# Patient Record
Sex: Male | Born: 1995 | Race: White | Hispanic: No | Marital: Single | Smoking: Current every day smoker
Health system: Southern US, Community
[De-identification: ages and names within clinical notes are randomized; demographics above are authoritative.]

## PROBLEM LIST (undated history)

## (undated) DIAGNOSIS — F329 Major depressive disorder, single episode, unspecified: Secondary | ICD-10-CM

## (undated) DIAGNOSIS — F509 Eating disorder, unspecified: Secondary | ICD-10-CM

## (undated) DIAGNOSIS — F901 Attention-deficit hyperactivity disorder, predominantly hyperactive type: Secondary | ICD-10-CM

## (undated) DIAGNOSIS — F419 Anxiety disorder, unspecified: Secondary | ICD-10-CM

## (undated) DIAGNOSIS — F32A Depression, unspecified: Secondary | ICD-10-CM

## (undated) DIAGNOSIS — F4310 Post-traumatic stress disorder, unspecified: Secondary | ICD-10-CM

---
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
## 2020-03-13 ENCOUNTER — Telehealth: Payer: Self-pay | Admitting: Family Medicine

## 2020-03-13 NOTE — Telephone Encounter (Signed)
Pt called wanting to make appointment with dr Para March.  Last time he in was in office 07/2015  Appointment for anxiety [employer]   Ok to schedule

## 2020-03-13 NOTE — Telephone Encounter (Signed)
30 min OV when possible.  Please check to see if any urgent issues.  Thanks.

## 2020-03-14 NOTE — Telephone Encounter (Signed)
Pt reports this is not acute. Scheduled pt for 7/20. Advised if anything is needed before apt to contact office. Pt verbalized understanding.

## 2020-03-21 ENCOUNTER — Ambulatory Visit (INDEPENDENT_AMBULATORY_CARE_PROVIDER_SITE_OTHER): Payer: No Typology Code available for payment source | Admitting: Family Medicine

## 2020-03-21 ENCOUNTER — Other Ambulatory Visit: Payer: Self-pay

## 2020-03-21 ENCOUNTER — Encounter: Payer: Self-pay | Admitting: Family Medicine

## 2020-03-21 VITALS — BP 102/72 | HR 65 | Temp 97.7°F | Ht 68.0 in | Wt 114.2 lb

## 2020-03-21 DIAGNOSIS — Z7189 Other specified counseling: Secondary | ICD-10-CM

## 2020-03-21 DIAGNOSIS — R4586 Emotional lability: Secondary | ICD-10-CM | POA: Diagnosis not present

## 2020-03-21 DIAGNOSIS — F39 Unspecified mood [affective] disorder: Secondary | ICD-10-CM | POA: Diagnosis not present

## 2020-03-21 NOTE — Patient Instructions (Signed)
Go to the lab on the way out.   If you have mychart we'll likely use that to update you.    Let me see about options in the meantime when I see your labs.  We'll be in touch.  Take care.  Glad to see you.

## 2020-03-21 NOTE — Progress Notes (Signed)
This visit occurred during the SARS-CoV-2 public health emergency.  Safety protocols were in place, including screening questions prior to the visit, additional usage of staff PPE, and extensive cleaning of exam room while observing appropriate contact time as indicated for disinfecting solutions.  Reestablishing care.  Seen here greater than 3 years ago.  Mood discussed with patient. More anxiety, dec in libido, dec in motivation.  Going on for months, more in the last few months.  Social detachment noted.  No SI/HI.  More irritable when anxious.    Discussed his weight, eating, his prev hx.  Appetite can be affected by THC, with variable intake.  "One part of me is fine with my weight and another part of me wants me bigger/muscular or smaller."  He would prefer not to see his weight on check in.    No recent self harm.    We talked about his mood variation specifically.  He has had episodes about once a month where he has increased energy, he is more likely to make "bad decisions" where he spends money, drinks more alcohol and has insomnia.  There has been an episode where he drove from [place] to [place] and then back because he could not sleep.  During these episodes he has difficulty getting calm.  They can last for days at a time.  Advance directive discussed with patient.  [employer] designated if patient were incapacitated.  Her phone number is [phone].  He is in a stable same-sex relationship with his partner [place].  He came out to his family when he was approximately 24 years of age.  He denies cocaine or heroin use but does drink alcohol.  He is smoked marijuana in the past.  He has cut back on alcohol use recently.  He vapes with an e-cigarette.  Meds, vitals, and allergies reviewed.   ROS: Per HPI unless specifically indicated in ROS section   GEN: nad, alert and oriented HEENT: ncat NECK: supple w/o LA CV: rrr.  no murmur PULM: ctab, no inc wob ABD: soft,  +bs EXT: no edema SKIN: no acute rash Speech normal.  No tremor.  Affect appropriate.  Judgment intact.  At least 30 minutes were devoted to patient care in this encounter (this can potentially include time spent reviewing the patient's file/history, interviewing and examining the patient, counseling/reviewing plan with patient, ordering referrals, ordering tests, reviewing relevant laboratory or x-ray data, and documenting the encounter).

## 2020-03-22 LAB — COMPREHENSIVE METABOLIC PANEL WITH GFR
ALT: 15 U/L (ref 0–53)
AST: 19 U/L (ref 0–37)
Albumin: 5.2 g/dL (ref 3.5–5.2)
Alkaline Phosphatase: 67 U/L (ref 39–117)
BUN: 11 mg/dL (ref 6–23)
CO2: 28 meq/L (ref 19–32)
Calcium: 10.7 mg/dL — ABNORMAL HIGH (ref 8.4–10.5)
Chloride: 104 meq/L (ref 96–112)
Creatinine, Ser: 1.06 mg/dL (ref 0.40–1.50)
GFR: 86.97 mL/min (ref >60.00)
Glucose, Bld: 84 mg/dL (ref 70–99)
Potassium: 5 meq/L (ref 3.5–5.1)
Sodium: 141 meq/L (ref 135–145)
Total Bilirubin: 0.9 mg/dL (ref 0.2–1.2)
Total Protein: 7.5 g/dL (ref 6.0–8.3)

## 2020-03-22 LAB — CBC WITH DIFFERENTIAL/PLATELET
Basophils Absolute: 0 K/uL (ref 0.0–0.1)
Basophils Relative: 0.4 % (ref 0.0–3.0)
Eosinophils Absolute: 0.1 K/uL (ref 0.0–0.7)
Eosinophils Relative: 1.3 % (ref 0.0–5.0)
HCT: 47.6 % (ref 39.0–52.0)
Hemoglobin: 16.8 g/dL (ref 13.0–17.0)
Lymphocytes Relative: 31.8 % (ref 12.0–46.0)
Lymphs Abs: 2.3 K/uL (ref 0.7–4.0)
MCHC: 35.2 g/dL (ref 30.0–36.0)
MCV: 89.6 fl (ref 78.0–100.0)
Monocytes Absolute: 0.5 K/uL (ref 0.1–1.0)
Monocytes Relative: 6.8 % (ref 3.0–12.0)
Neutro Abs: 4.3 K/uL (ref 1.4–7.7)
Neutrophils Relative %: 59.7 % (ref 43.0–77.0)
Platelets: 162 K/uL (ref 150.0–400.0)
RBC: 5.31 Mil/uL (ref 4.22–5.81)
RDW: 13 % (ref 11.5–15.5)
WBC: 7.1 K/uL (ref 4.0–10.5)

## 2020-03-22 LAB — TSH: TSH: 1.14 u[IU]/mL (ref 0.35–4.50)

## 2020-03-24 ENCOUNTER — Encounter: Payer: Self-pay | Admitting: Family Medicine

## 2020-03-24 ENCOUNTER — Other Ambulatory Visit: Payer: Self-pay | Admitting: Family Medicine

## 2020-03-24 DIAGNOSIS — Z7189 Other specified counseling: Secondary | ICD-10-CM | POA: Insufficient documentation

## 2020-03-24 MED ORDER — DIVALPROEX SODIUM 500 MG PO DR TAB: 500.0000 mg | DELAYED_RELEASE_TABLET | Freq: Every day | ORAL | 1 refills | Status: DC

## 2020-03-24 MED ORDER — DIVALPROEX SODIUM 500 MG PO DR TAB: 500.0000 mg | DELAYED_RELEASE_TABLET | Freq: Three times a day (TID) | ORAL | 1 refills | Status: DC

## 2020-03-24 NOTE — Assessment & Plan Note (Signed)
I suspect based on his history described above with episodic periods of insomnia, increased energy, etc. that he likely is bipolar, discussed with patient.  I think it makes sense to check his labs first and he agrees.  See notes on labs.  We deferred medication options until we can get his labs resulted.  He does have history of substance use with vaping, alcohol, but I do not suspect that he has a substance induced mood disorder.  He may have a substance influenced mood disorder but I do not think substance is the primary issue here.  No suicidal homicidal intent and okay for outpatient follow-up.  We talked about his previous eating habits.  He prefers not to have his weight revealed on check-in and he can remind [place] about this on check-in.  See notes on labs.  We talked about getting him set up with [facility] again with counseling.  See above.

## 2020-03-24 NOTE — Assessment & Plan Note (Signed)
Advance directive discussed with patient.  [employer] designated if patient were incapacitated.  Her phone number is [phone].

## 2020-03-30 ENCOUNTER — Telehealth: Payer: Self-pay | Admitting: Family Medicine

## 2020-03-30 NOTE — Telephone Encounter (Signed)
Called Heather Kitchens office to try to set up Appt for patient. She is not accepting New patients at this time. Called patient to ask if he wanted me to send Referral to [facility] and he declined, appointment with you next Tuesday.

## 2020-03-31 NOTE — Telephone Encounter (Signed)
Noted, will d/w pt at OV next week.

## 2020-04-04 ENCOUNTER — Ambulatory Visit: Payer: No Typology Code available for payment source | Admitting: Family Medicine

## 2020-04-04 ENCOUNTER — Other Ambulatory Visit: Payer: Self-pay

## 2020-04-04 ENCOUNTER — Encounter: Payer: Self-pay | Admitting: Family Medicine

## 2020-04-04 VITALS — BP 106/60 | HR 73 | Temp 97.6°F | Ht 68.0 in | Wt 114.2 lb

## 2020-04-04 DIAGNOSIS — F39 Unspecified mood [affective] disorder: Secondary | ICD-10-CM | POA: Diagnosis not present

## 2020-04-04 DIAGNOSIS — R4586 Emotional lability: Secondary | ICD-10-CM | POA: Diagnosis not present

## 2020-04-04 NOTE — Patient Instructions (Signed)
Go to the lab on the way out.   If you have mychart we'll likely use that to update you.    Don't change your meds for now.  We'll be in touch.  Take care.  Glad to see you. 

## 2020-04-04 NOTE — Progress Notes (Signed)
This visit occurred during the SARS-CoV-2 public health emergency.  Safety protocols were in place, including screening questions prior to the visit, additional usage of staff PPE, and extensive cleaning of exam room while observing appropriate contact time as indicated for disinfecting solutions.  Started on depakote in the meantime.  He feels cold from the waist up since starting med.  He was a little more irritable in the meantime, but still okay for outpatient follow-up.  Some nausea in the AMs.  Unclear if he was going to be more irritable in the meantime anyway due to an underlying mood change or if this was related to medication use.  He did not think his symptoms were severe enough to stop the medication at this point.  We both agreed that he had relatively small sample size in terms of days on treatment so far with Depakote and we may not be able to make an adequate determination about mood variation that would have happened anyway versus mood variation related to medication use.  His mood overall is lower than average now but not at the extreme of depression.  He does not endorse recent manic symptoms.  He had one day with skipped dose and his concentration was clearly worse that day.    Meds, vitals, and allergies reviewed.   ROS: Per HPI unless specifically indicated in ROS section   GEN: nad, alert and oriented HEENT:  ncat NECK: supple w/o LA CV: rrr. PULM: ctab, no inc wob ABD: soft, +bs EXT: no edema SKIN: no acute rash Speech normal.  No tremor. Normal capillary refill but his hands do feel cooler compared to his legs.  Normal radial pulse bilaterally.

## 2020-04-05 LAB — COMPREHENSIVE METABOLIC PANEL
ALT: 10 U/L (ref 0–53)
AST: 20 U/L (ref 0–37)
Albumin: 5.2 g/dL (ref 3.5–5.2)
Alkaline Phosphatase: 65 U/L (ref 39–117)
BUN: 12 mg/dL (ref 6–23)
CO2: 29 mEq/L (ref 19–32)
Calcium: 10.4 mg/dL (ref 8.4–10.5)
Chloride: 102 mEq/L (ref 96–112)
Creatinine, Ser: 0.95 mg/dL (ref 0.40–1.50)
GFR: 98.65 mL/min (ref >60.00)
Glucose, Bld: 81 mg/dL (ref 70–99)
Potassium: 4.1 mEq/L (ref 3.5–5.1)
Sodium: 141 mEq/L (ref 135–145)
Total Bilirubin: 0.7 mg/dL (ref 0.2–1.2)
Total Protein: 7.7 g/dL (ref 6.0–8.3)

## 2020-04-05 NOTE — Assessment & Plan Note (Signed)
Still suspect BAD.  D/w pt about options at this point. No suicidal or homicidal intent.  Started on depakote in the meantime.  He feels cold from the waist up since starting med.  He was a little more irritable in the meantime, but still okay for outpatient follow-up.  Some nausea in the AMs.  Unclear if he was going to be more irritable in the meantime anyway due to an underlying mood change or if this was related to medication use.  He did not think his symptoms were severe enough to stop the medication at this point.  We both agreed that he had relatively small sample size in terms of days on treatment so far with Depakote and we may not be able to make an adequate determination about mood variation that would have happened anyway versus mood variation related to medication use.  Check LFTs I will check on psychiatry options.  Okay with patient to refer to psychiatry.  Continue med for now.  He'll monitor Raynaud's sx.  It may improve.

## 2020-04-06 ENCOUNTER — Other Ambulatory Visit: Payer: Self-pay | Admitting: Family Medicine

## 2020-04-06 DIAGNOSIS — R4586 Emotional lability: Secondary | ICD-10-CM

## 2020-04-15 ENCOUNTER — Other Ambulatory Visit: Payer: Self-pay | Admitting: Family Medicine

## 2020-05-22 ENCOUNTER — Ambulatory Visit: Payer: No Typology Code available for payment source | Admitting: Family Medicine

## 2020-05-25 ENCOUNTER — Encounter: Payer: Self-pay | Admitting: Family Medicine

## 2020-05-25 ENCOUNTER — Other Ambulatory Visit: Payer: Self-pay

## 2020-05-25 ENCOUNTER — Ambulatory Visit (INDEPENDENT_AMBULATORY_CARE_PROVIDER_SITE_OTHER): Payer: No Typology Code available for payment source | Admitting: Family Medicine

## 2020-05-25 VITALS — BP 102/62 | HR 80 | Temp 96.7°F | Ht 68.0 in | Wt 118.2 lb

## 2020-05-25 DIAGNOSIS — R252 Cramp and spasm: Secondary | ICD-10-CM | POA: Diagnosis not present

## 2020-05-25 DIAGNOSIS — F39 Unspecified mood [affective] disorder: Secondary | ICD-10-CM | POA: Diagnosis not present

## 2020-05-25 LAB — HEPATIC FUNCTION PANEL
ALT: 10 U/L (ref 0–53)
AST: 16 U/L (ref 0–37)
Albumin: 4.5 g/dL (ref 3.5–5.2)
Alkaline Phosphatase: 48 U/L (ref 39–117)
Bilirubin, Direct: 0.1 mg/dL (ref 0.0–0.3)
Total Bilirubin: 0.8 mg/dL (ref 0.2–1.2)
Total Protein: 6.6 g/dL (ref 6.0–8.3)

## 2020-05-25 NOTE — Progress Notes (Signed)
This visit occurred during the SARS-CoV-2 public health emergency.  Safety protocols were in place, including screening questions prior to the visit, additional usage of staff PPE, and extensive cleaning of exam room while observing appropriate contact time as indicated for disinfecting solutions.  Muscle cramps.  This was longstanding but worse recently.  Leg cramps, bilateral.  Hamstring vs calf.  R>L leg.  Usually just one side or the other, rarely B affected at the same time.  No anterior leg cramping.  Not exertional.  Usually happens at rest.  Getting up and hobbling around helps work it out.  Not in the arms.  Sx almost always happen at night.   He is on depakote 500mg  a day.  He is "in a better mood, more balanced, more normal."  The highs and lows are closer to the middle.  He feels better about it.  Still with some nausea but some better than prev.  Nausea can predate the depakote use in the AM.  Better with eating.  No vomiting, no diarrhea.   We talked about psych f/u pending, he is scheduled for the end of 06/2020.    This predates depakote use, but more noted now.  It is a possible side effect, ~1-5%.    Meds, vitals, and allergies reviewed.   ROS: Per HPI unless specifically indicated in ROS section   GEN: nad, alert and oriented HEENT: ncat NECK: supple w/o LA CV: rrr.  PULM: ctab, no inc wob ABD: soft, +bs EXT: no edema SKIN: no acute rash Normal posterior tibial pulses bilaterally. Quads and hamstrings and calves not tender to palpation bilaterally.  Skin well perfused.

## 2020-05-25 NOTE — Patient Instructions (Signed)
Drink enough water to keep your urine clear.  Add some salt to your meals.  Stretch prior to bed.   Go to the lab on the way out.   If you have mychart we'll likely use that to update you.    Take care.  Glad to see you.

## 2020-05-28 DIAGNOSIS — R252 Cramp and spasm: Secondary | ICD-10-CM | POA: Insufficient documentation

## 2020-05-28 NOTE — Assessment & Plan Note (Signed)
With recent unremarkable labs.  Discussed options Advised to drink enough water to keep urine clear.  Add some salt to meals.  Stretch prior to bed.   Update me as needed.  He agrees.  He does not have claudication.

## 2020-05-28 NOTE — Assessment & Plan Note (Signed)
Mood is improved on Depakote.  Still okay for outpatient follow-up.  No suicidal or homicidal intent.  Reasonable to recheck LFTs.  He has follow-up with psychiatry pending and we both agreed that it made sense to leave his medication as is and follow-up with psychiatry, since that is in the relatively near future.

## 2020-07-31 ENCOUNTER — Telehealth: Payer: Self-pay

## 2020-07-31 NOTE — Telephone Encounter (Signed)
[facility] - Client TELEPHONE ADVICE RECORD [facility] Patient Name: Daniel Velazquez DOBRATZ Gender: Male DOB: [birth year] Age: 24 Y 10 M 8 D Return Phone Number: [phone] (Primary) Address: City/State/Zip: [place] [address] Client [facility] - Client Client Site [facility] - Day Physician Raechel Ache - MD Contact Type Call Who Is Calling Patient / Member / Family / Caregiver Call Type Triage / Clinical Relationship To Patient Self Return Phone Number [phone] (Primary) Chief Complaint Toe Injury Reason for Call Symptomatic / Request for Health Information Initial Comment Caller states he injured his toe. Translation No Nurse Assessment Nurse: Freida Busman, RN, Diane Date/Time Lamount Cohen Time): 07/31/2020 9:40:42 AM Confirm and document reason for call. If symptomatic, describe symptoms. ---Caller states he injured his toe. Dropped a mannequin on his right great toe. Where it hit there was some bleeding. Toe is swollen. Not as bad as last night. Toe is painful. Able to walk, but hurts to put weight on it. Does the patient have any new or worsening symptoms? ---Yes Will a triage be completed? ---Yes Related visit to physician within the last 2 weeks? ---No Does the PT have any chronic conditions? (i.e. diabetes, asthma, this includes High risk factors for pregnancy, etc.) ---No Is this a behavioral health or substance abuse call? ---No Guidelines Guideline Title Affirmed Question Affirmed Notes Nurse Date/Time (Eastern Time) Toe Injury Looks like a broken bone (e.g., crooked or deformed) Freida Busman, RN, Diane 07/31/2020 9:43:16 AM Disp. Time Lamount Cohen Time) Disposition Final User 07/31/2020 9:47:20 AM See HCP within 4 Hours (or PCP triage) Yes Freida Busman, RN, Diane Caller Disagree/Comply Disagree Caller Understands Yes PreDisposition Call Doctor PLEASE NOTE: All timestamps contained within this report are  represented as [place] Standard Time. CONFIDENTIALTY NOTICE: This fax transmission is intended only for the addressee. It contains information that is legally privileged, confidential or otherwise protected from use or disclosure. If you are not the intended recipient, you are strictly prohibited from reviewing, disclosing, copying using or disseminating any of this information or taking any action in reliance on or regarding this information. If you have received this fax in error, please notify us immediately by telephone so that we can arrange for its return to [place]. Phone: [phone], Toll-Free: [phone], Fax: [phone] Page: 2 of 2 Call Id: [id number] Care Advice Given Per Guideline SEE HCP (OR PCP TRIAGE) WITHIN 4 HOURS: * IF OFFICE WILL BE OPEN: You need to be seen within the next 3 or 4 hours. Call your doctor (or NP/PA) now or as soon as the office opens. USE A COLD PACK FOR PAIN, SWELLING, OR BRUISING: * Put a cold pack or an ice bag (wrapped in a moist towel) on the area for 20 minutes. PAIN MEDICINES: * ACETAMINOPHEN - REGULAR STRENGTH TYLENOL: Take 650 mg (two 325 mg pills) by mouth every 4 to 6 hours as needed. Each Regular Strength Tylenol pill has 325 mg of acetaminophen. The most you should take each day is 3,250 mg (10 pills a day). * IBUPROFEN (E.G., MOTRIN, ADVIL): Take 400 mg (two 200 mg pills) by mouth every 6 hours. The most you should take each day is 1,200 mg (six 200 mg pills), unless your doctor has told you to take more. * Tape the injured toe to the toe next to it (this is called a buddy splint). FIRST AID ADVICE FOR SUSPECTED TOE FRACTURE (BROKEN BONE) OR DISLOCATION (OUT OF JOINT): * You become worse CALL BACK IF:  CARE ADVICE given per Toe Injury (Adult) guideline. Comments User: Matt Holmes, RN Date/Time Lamount Cohen Time): 07/31/2020 9:48:17 AM Pt. has an appt. booked for tomorrow with his doctor. Prefers to wait until then instead of seeing a different doc or  going to UC. Referrals REFERRED TO PCP OFFICE

## 2020-08-01 ENCOUNTER — Ambulatory Visit (INDEPENDENT_AMBULATORY_CARE_PROVIDER_SITE_OTHER): Payer: No Typology Code available for payment source | Admitting: Family Medicine

## 2020-08-01 ENCOUNTER — Other Ambulatory Visit: Payer: Self-pay

## 2020-08-01 ENCOUNTER — Ambulatory Visit (INDEPENDENT_AMBULATORY_CARE_PROVIDER_SITE_OTHER)
Admission: RE | Admit: 2020-08-01 | Discharge: 2020-08-01 | Disposition: A | Discharge status: Home / Self Care | Payer: No Typology Code available for payment source | Source: Ambulatory Visit | Attending: Family Medicine | Admitting: Family Medicine

## 2020-08-01 ENCOUNTER — Encounter: Payer: Self-pay | Admitting: Family Medicine

## 2020-08-01 VITALS — BP 90/60 | HR 98 | Temp 98.4°F | Ht 68.5 in | Wt 113.0 lb

## 2020-08-01 DIAGNOSIS — M79671 Pain in right foot: Secondary | ICD-10-CM

## 2020-08-01 NOTE — Patient Instructions (Signed)
Ice for 5 minutes at a time.  Use the post op shoe when weight bearing.  Don't drive in it.   Should gradually get better.  Update me as needed.  Take care.  Glad to see you.

## 2020-08-01 NOTE — Progress Notes (Signed)
This visit occurred during the SARS-CoV-2 public health emergency.  Safety protocols were in place, including screening questions prior to the visit, additional usage of staff PPE, and extensive cleaning of exam room while observing appropriate contact time as indicated for disinfecting solutions.  Mood has been variable, likely affected by his work schedule.  He has been working a lot with the holiday schedule.  No ADE on depakote (except for nightmares that could be related to Depakote or work schedule changes) and he thought that was helping to limit his mood swings.  See follow-up phone note.  At work 07/30/20, mannequin fell and hit R 1st toe.  Pain walking.  Bruised.  No FCNAVD.    Meds, vitals, and allergies reviewed.   ROS: Per HPI unless specifically indicated in ROS section   nad ncat Speech normal.  Affect appropriate.  Judgment intact. Right foot with normal inspection except for bruising proximal to R 1st toenail dorsally.  Toenail still intact. ttp at R1st IP joint, not ttp at MTP.   Not ttp o/w.  Midfoot, medial and lateral malleoli, Achilles not tender to palpation.  Intact dorsalis pedis pulse with normal perfusion.  Able to bear weight. He felt better walking in a hard postop shoe

## 2020-08-01 NOTE — Telephone Encounter (Signed)
Thanks

## 2020-08-02 ENCOUNTER — Telehealth: Payer: Self-pay | Admitting: Family Medicine

## 2020-08-02 DIAGNOSIS — M79671 Pain in right foot: Secondary | ICD-10-CM | POA: Insufficient documentation

## 2020-08-02 NOTE — Telephone Encounter (Signed)
Please update patient.  I did some extra checking.  It is possible to have abnormal dreams on Depakote, around 1-5% of the time.  Given that his nightmares coincided with a significant change in his work schedule around the holidays, I think it makes sense to give this a little bit more time and try to keep going normal sleep schedule.  If he keeps having trouble then we may have to change the medication.  Let me know if his symptoms persist or escalate.  Thanks.

## 2020-08-02 NOTE — Assessment & Plan Note (Signed)
No fracture seen and he feels better in a postop shoe.  Advised not to wear this when he was driving but he can wear when weightbearing otherwise.  He can ice as needed.  I would expect this to gradually improve and he can gradually wean back to wearing a regular shoe.  He will update me as needed.

## 2020-08-03 NOTE — Telephone Encounter (Signed)
Message sent thru MyChart 

## 2020-10-23 ENCOUNTER — Other Ambulatory Visit: Payer: Self-pay | Admitting: Family Medicine

## 2021-10-26 ENCOUNTER — Other Ambulatory Visit: Payer: Self-pay

## 2021-10-26 ENCOUNTER — Ambulatory Visit (INDEPENDENT_AMBULATORY_CARE_PROVIDER_SITE_OTHER): Payer: No Typology Code available for payment source | Admitting: Nurse Practitioner

## 2021-10-26 VITALS — BP 108/76 | HR 76 | Temp 98.2°F | Resp 12 | Ht 68.5 in | Wt 115.5 lb

## 2021-10-26 DIAGNOSIS — J020 Streptococcal pharyngitis: Secondary | ICD-10-CM | POA: Diagnosis not present

## 2021-10-26 DIAGNOSIS — J029 Acute pharyngitis, unspecified: Secondary | ICD-10-CM

## 2021-10-26 DIAGNOSIS — J358 Other chronic diseases of tonsils and adenoids: Secondary | ICD-10-CM | POA: Diagnosis not present

## 2021-10-26 LAB — POCT RAPID STREP A (OFFICE): Rapid Strep A Screen: POSITIVE — AB

## 2021-10-26 MED ORDER — AMOXICILLIN 500 MG PO CAPS: 500.0000 mg | ORAL_CAPSULE | Freq: Two times a day (BID) | ORAL | 0 refills | Status: AC

## 2021-10-26 NOTE — Patient Instructions (Signed)
Avoid salt water rinses for now. Use tylenol or ibuprofen to help with the sore throat. You can use cough drops too. You can take the first dose of amoxicillin after you get it and then again tonight You can go to work tomorrow if your dont have a fever

## 2021-10-26 NOTE — Assessment & Plan Note (Signed)
Tonsil stone dislodged by patient prior to coming to office.  Did discourage doing salt water rinses as the tissue will need to to heal where the stone was.  Patient acknowledged

## 2021-10-26 NOTE — Progress Notes (Signed)
Acute Office Visit  Subjective:    Patient ID: Daniel Velazquez, male    DOB: [birth year], 26 y.o.   MRN: [id number]  Chief Complaint  Patient presents with   tonsil stone    Yesterday had a tonsil stone and patient was able to get it out. There is a hole there where the stone was and made patient worry. Some swelling and soreness present.     HPI Patient is in today for tonsil issue  States that he noticed a tonsil stone and dislodged it with Warm saltwater gargles. He has continued to use the gargles as it  did seem to help with the throat discomfort. States that the tonisl woke him up that night. States that he noitced a hole where the stone was lodged and this concerned him. States that the discomfort has improved since waking up today. States that he did a salt water gargle and mouth wash prior to coming into office Has not tried tylenol or ibuprofen yet. Did try some cough drops. No sick contacts  Past Medical History:  Diagnosis Date   ADHD, hyperactive-impulsive type 09/20/2013   Anxiety    Depression    Eating disorder    PTSD (post-traumatic stress disorder)     No past surgical history on file.  Family History  Problem Relation Age of Onset   Mental illness Sister        anxiety, possibly bipolar disorder   Stroke Maternal Aunt    Hypertension Paternal Grandmother    Mental illness Maternal Grandmother        anxiety   Mental illness Paternal Grandfather        anxiety   Prostate cancer Paternal Grandfather     Social History   Socioeconomic History   Marital status: Single    Spouse name: n/a   Number of children: 0   Years of education: Not on file   Highest education level: Not on file  Occupational History   Occupation: MINOR    Employer: STUDENT    Comment: student ([employer])  Tobacco Use   Smoking status: Every Day    Types: E-cigarettes   Smokeless tobacco: Never  Substance and Sexual Activity   Alcohol use: Yes    Alcohol/week:  0.0 standard drinks   Drug use: No   Sexual activity: Yes    Partners: Male  Other Topics Concern   Not on file  Social History Narrative   Single but in a long-term relationship.   Working Scientist, research (medical) as of 2021   Social Determinants of Radio broadcast assistant Strain: Not on [employer] Insecurity: Not on file  Transportation Needs: Not on file  Physical Activity: Not on file  Stress: Not on file  Social Connections: Not on file  Intimate Partner Violence: Not on file    Outpatient Medications Prior to Visit  Medication Sig Dispense Refill   divalproex (DEPAKOTE) 500 MG DR tablet TAKE 1 TABLET BY MOUTH EVERY DAY 90 tablet 1   No facility-administered medications prior to visit.    Allergies  Allergen Reactions   Pollen Extract     cough    Review of Systems  Constitutional:  Negative for chills and fever.  HENT:  Positive for congestion (baseline) and sore throat. Negative for ear discharge, ear pain, postnasal drip, sinus pressure and sinus pain.   Respiratory:  Negative for cough and shortness of breath.   Gastrointestinal:  Negative for diarrhea, nausea and vomiting.  Musculoskeletal:  Negative for arthralgias and myalgias.  Neurological:  Negative for headaches.      Objective:    Physical Exam Vitals and nursing note reviewed.  Constitutional:      Appearance: Normal appearance.  HENT:     Head:     Comments: Noticed portion of right tonsil that was paler color. This is where the patient stated he removed the tonsil stone. Tissue is a paler color at the superior medial     Right Ear: Tympanic membrane, ear canal and external ear normal. There is no impacted cerumen.     Left Ear: Tympanic membrane, ear canal and external ear normal. There is no impacted cerumen.     Mouth/Throat:     Mouth: Mucous membranes are moist.     Pharynx: Oropharynx is clear. Posterior oropharyngeal erythema present.  Cardiovascular:     Rate and Rhythm: Normal rate and regular  rhythm.     Heart sounds: Normal heart sounds.  Pulmonary:     Effort: Pulmonary effort is normal.     Breath sounds: Normal breath sounds.  Abdominal:     General: Bowel sounds are normal.  Lymphadenopathy:     Cervical: Cervical adenopathy (anterior) present.  Neurological:     Mental Status: He is alert.    BP 108/76    Pulse 76    Temp 98.2 F (36.8 C)    Resp 12    Ht 5' 8.5" (1.74 m)    Wt 115 lb 8 oz (52.4 kg)    SpO2 99%    BMI 17.31 kg/m  Wt Readings from Last 3 Encounters:  10/26/21 115 lb 8 oz (52.4 kg)  08/01/20 113 lb (51.3 kg)  05/25/20 118 lb 4 oz (53.6 kg)    Health Maintenance Due  Topic Date Due   HIV Screening  Never done   HPV VACCINES (2 - Male 3-dose series) 07/20/2014       Topic Date Due   HPV VACCINES (2 - Male 3-dose series) 07/20/2014     Lab Results  Component Value Date   TSH 1.14 03/21/2020   Lab Results  Component Value Date   WBC 7.1 03/21/2020   HGB 16.8 03/21/2020   HCT 47.6 03/21/2020   MCV 89.6 03/21/2020   PLT 162.0 03/21/2020   Lab Results  Component Value Date   NA 141 04/04/2020   K 4.1 04/04/2020   CO2 29 04/04/2020   GLUCOSE 81 04/04/2020   BUN 12 04/04/2020   CREATININE 0.95 04/04/2020   BILITOT 0.8 05/25/2020   ALKPHOS 48 05/25/2020   AST 16 05/25/2020   ALT 10 05/25/2020   PROT 6.6 05/25/2020   ALBUMIN 4.5 05/25/2020   CALCIUM 10.4 04/04/2020   ANIONGAP 7 05/04/2015   GFR 98.65 04/04/2020   Lab Results  Component Value Date   CHOL 128 09/22/2013   Lab Results  Component Value Date   HDL 51 09/22/2013   Lab Results  Component Value Date   LDLCALC 68 09/22/2013   Lab Results  Component Value Date   TRIG 44 09/22/2013   Lab Results  Component Value Date   CHOLHDL 2.5 09/22/2013   No results found for: HGBA1C     Assessment & Plan:   Problem List Items Addressed This Visit       Respiratory   Tonsil stone    Tonsil stone dislodged by patient prior to coming to office.  Did discourage  doing salt water rinses as the  tissue will need to to heal where the stone was.  Patient acknowledged      Strep pharyngitis    Strep test was positive in office.  We will treat with amoxicillin 500 mg twice daily x10 days.  We will write patient out today to give 24 hours worth of antibiotics time to work.  Patient will follow-up signs and symptoms do not improve.  Did encourage use Tylenol, ibuprofen, throat lozenges as needed for throat discomfort we want to avoid salt water gargles currently to help tissue of tonsil data      Relevant Medications   amoxicillin (AMOXIL) 500 MG capsule     Other   Sore throat - Primary   Relevant Orders   Rapid Strep A (Completed)     Meds ordered this encounter  Medications   amoxicillin (AMOXIL) 500 MG capsule    Sig: Take 1 capsule (500 mg total) by mouth 2 (two) times daily for 10 days.    Dispense:  30 capsule    Refill:  0    Order Specific Question:   Supervising Provider    Answer:   Loura Pardon A [[id number]]   This visit occurred during the SARS-CoV-2 public health emergency.  Safety protocols were in place, including screening questions prior to the visit, additional usage of staff PPE, and extensive cleaning of exam room while observing appropriate contact time as indicated for disinfecting solutions.    Romilda Garret, NP

## 2021-10-26 NOTE — Assessment & Plan Note (Signed)
Strep test was positive in office.  We will treat with amoxicillin 500 mg twice daily x10 days.  We will write patient out today to give 24 hours worth of antibiotics time to work.  Patient will follow-up signs and symptoms do not improve.  Did encourage use Tylenol, ibuprofen, throat lozenges as needed for throat discomfort we want to avoid salt water gargles currently to help tissue of tonsil data

## 2022-05-31 IMAGING — DX DG FOOT COMPLETE 3+V*R*
3 series · 3 of 3 positions shown · non-contrast
Comparison: None.

CLINICAL DATA: Right great toe injury

EXAM:
RIGHT FOOT COMPLETE - 3+ VIEW

[foot ap]
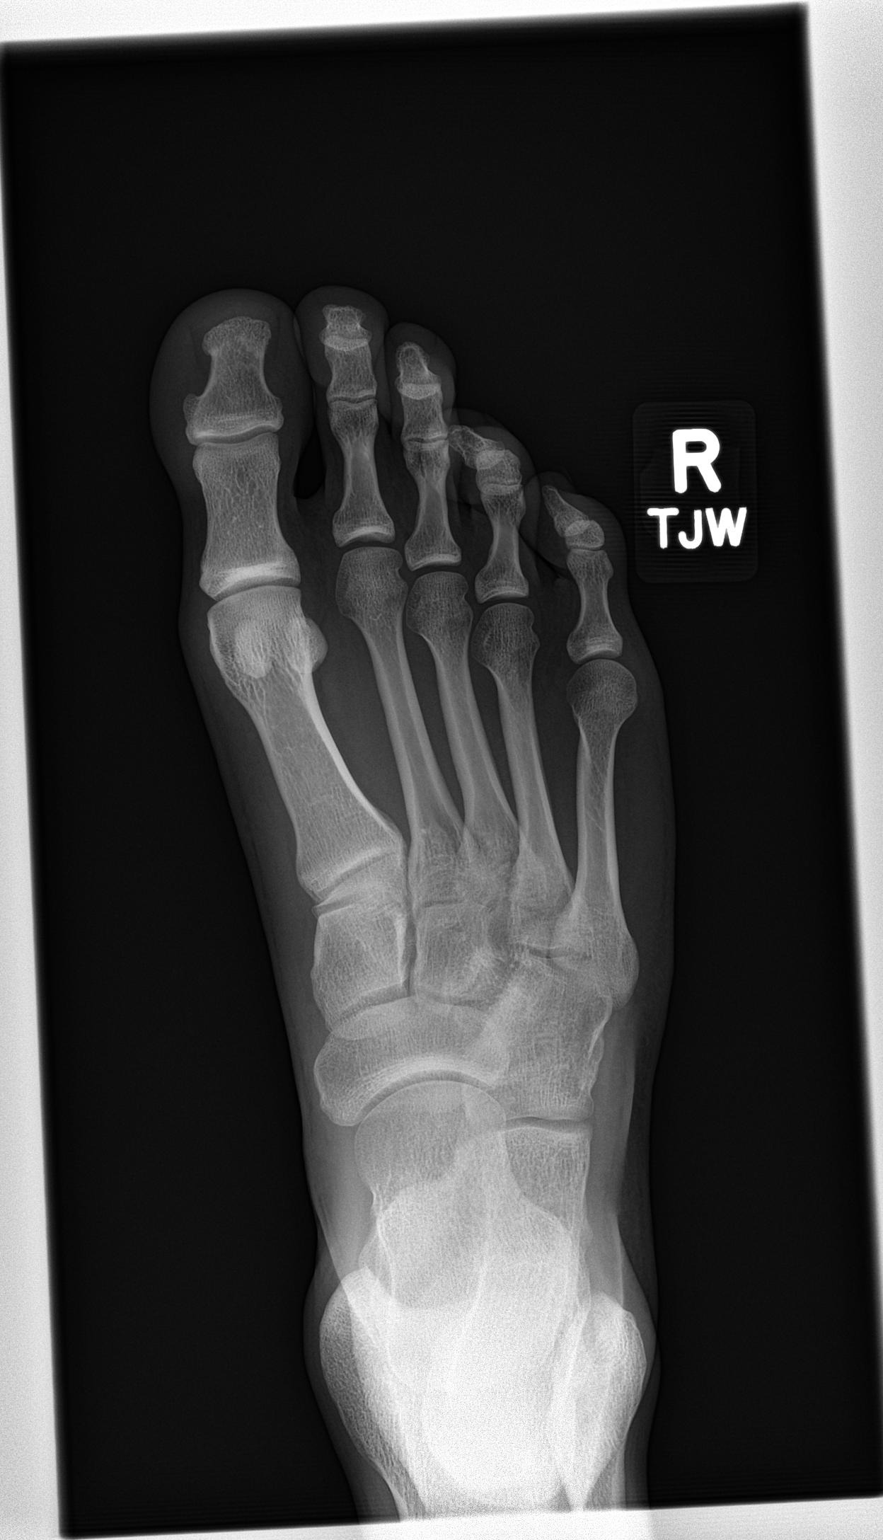

[foot obl]
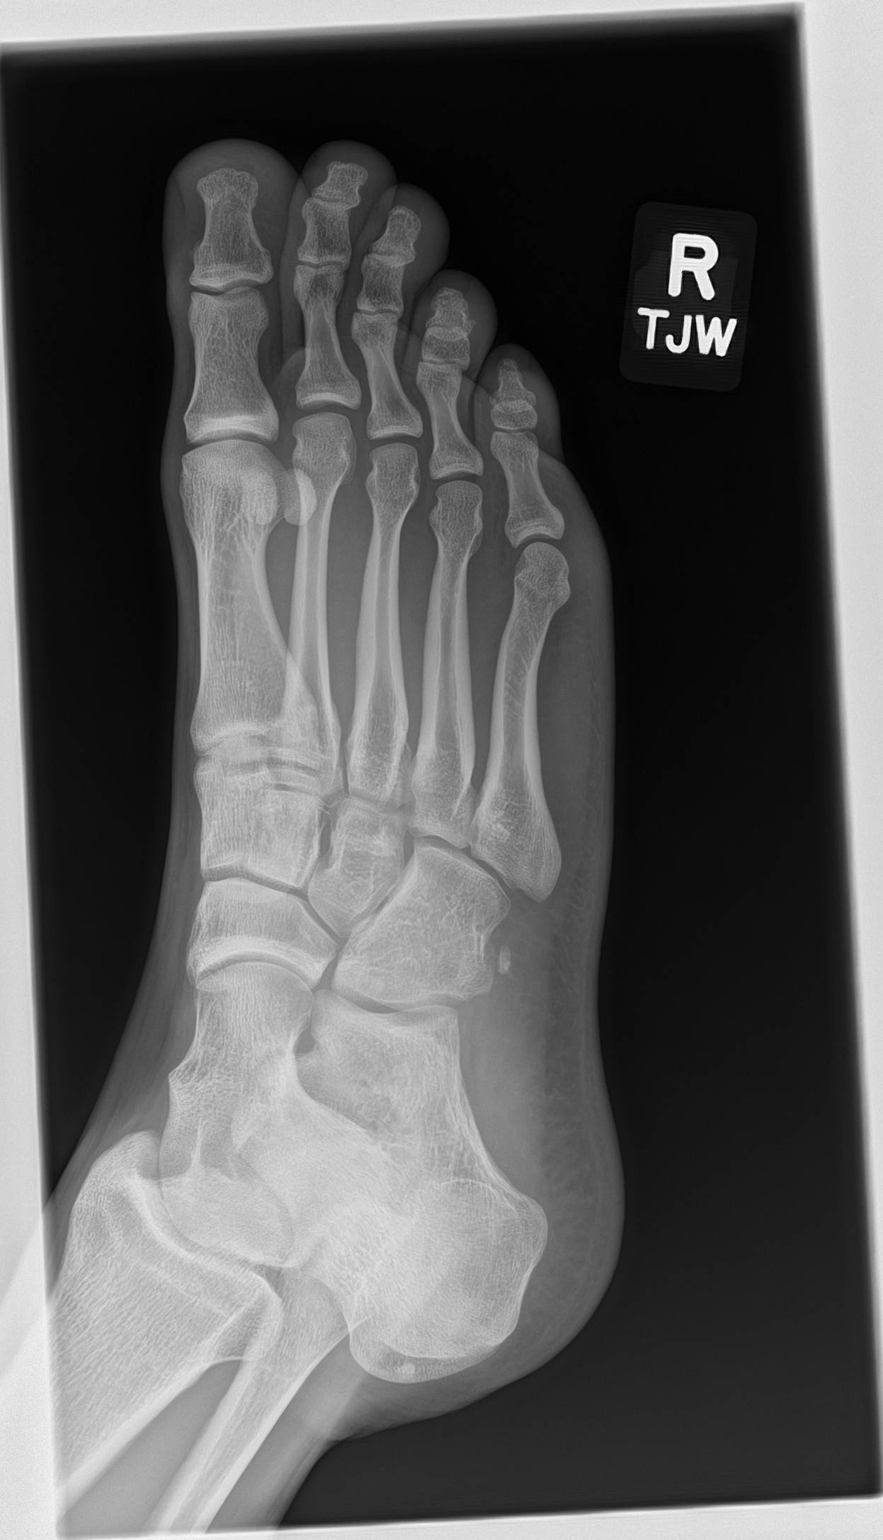

[foot lat]
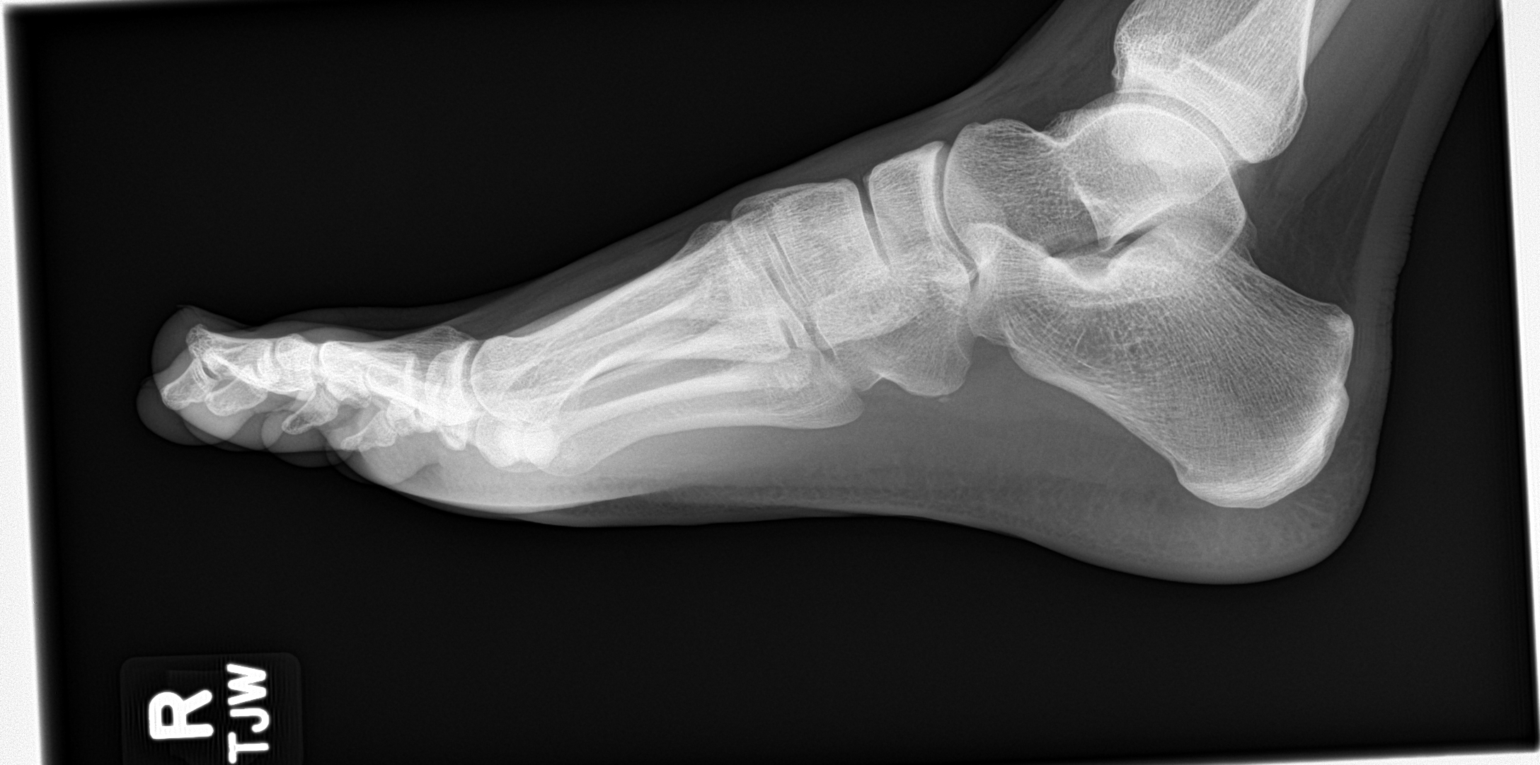

[3 of 3 positions shown; findings below may reference images not displayed]

FINDINGS: There is no evidence of fracture or dislocation. There is no
evidence of arthropathy or other focal bone abnormality. Soft
tissues are unremarkable.
IMPRESSION: Negative.
